# Patient Record
Sex: Female | Born: 1985 | Race: White | Hispanic: No | Marital: Married | Smoking: Never smoker
Health system: Southern US, Community
[De-identification: ages and names within clinical notes are randomized; demographics above are authoritative.]

## PROBLEM LIST (undated history)

## (undated) DIAGNOSIS — E039 Hypothyroidism, unspecified: Secondary | ICD-10-CM

## (undated) HISTORY — DX: Hypothyroidism, unspecified: E03.9

---

## 2018-02-03 DIAGNOSIS — Z01419 Encounter for gynecological examination (general) (routine) without abnormal findings: Secondary | ICD-10-CM | POA: Diagnosis not present

## 2018-06-02 ENCOUNTER — Encounter: Payer: Self-pay | Admitting: Physician Assistant

## 2018-06-02 ENCOUNTER — Other Ambulatory Visit: Payer: Self-pay

## 2018-06-02 ENCOUNTER — Ambulatory Visit (INDEPENDENT_AMBULATORY_CARE_PROVIDER_SITE_OTHER): Payer: 59 | Admitting: Physician Assistant

## 2018-06-02 ENCOUNTER — Ambulatory Visit (INDEPENDENT_AMBULATORY_CARE_PROVIDER_SITE_OTHER): Payer: 59

## 2018-06-02 VITALS — BP 112/64 | HR 62 | Temp 98.9°F | Resp 18 | Ht 63.19 in | Wt 157.0 lb

## 2018-06-02 DIAGNOSIS — Z13228 Encounter for screening for other metabolic disorders: Secondary | ICD-10-CM

## 2018-06-02 DIAGNOSIS — Z23 Encounter for immunization: Secondary | ICD-10-CM | POA: Diagnosis not present

## 2018-06-02 DIAGNOSIS — N63 Unspecified lump in unspecified breast: Secondary | ICD-10-CM

## 2018-06-02 DIAGNOSIS — Z130 Encounter for screening for diseases of the blood and blood-forming organs and certain disorders involving the immune mechanism: Secondary | ICD-10-CM

## 2018-06-02 DIAGNOSIS — Z0001 Encounter for general adult medical examination with abnormal findings: Secondary | ICD-10-CM

## 2018-06-02 DIAGNOSIS — Z1389 Encounter for screening for other disorder: Secondary | ICD-10-CM

## 2018-06-02 DIAGNOSIS — E039 Hypothyroidism, unspecified: Secondary | ICD-10-CM | POA: Diagnosis not present

## 2018-06-02 DIAGNOSIS — Z0000 Encounter for general adult medical examination without abnormal findings: Secondary | ICD-10-CM

## 2018-06-02 DIAGNOSIS — Z7689 Persons encountering health services in other specified circumstances: Secondary | ICD-10-CM

## 2018-06-02 DIAGNOSIS — R918 Other nonspecific abnormal finding of lung field: Secondary | ICD-10-CM | POA: Diagnosis not present

## 2018-06-02 NOTE — Patient Instructions (Addendum)
Health Maintenance, Female Adopting a healthy lifestyle and getting preventive care can go a long way to promote health and wellness. Talk with your health care provider about what schedule of regular examinations is right for you. This is a good chance for you to check in with your provider about disease prevention and staying healthy. In between checkups, there are plenty of things you can do on your own. Experts have done a lot of research about which lifestyle changes and preventive measures are most likely to keep you healthy. Ask your health care provider for more information. Weight and diet Eat a healthy diet  Be sure to include plenty of vegetables, fruits, low-fat dairy products, and lean protein.  Do not eat a lot of foods high in solid fats, added sugars, or salt.  Get regular exercise. This is one of the most important things you can do for your health. ? Most adults should exercise for at least 150 minutes each week. The exercise should increase your heart rate and make you sweat (moderate-intensity exercise). ? Most adults should also do strengthening exercises at least twice a week. This is in addition to the moderate-intensity exercise.  Maintain a healthy weight  Body mass index (BMI) is a measurement that can be used to identify possible weight problems. It estimates body fat based on height and weight. Your health care provider can help determine your BMI and help you achieve or maintain a healthy weight.  For females 20 years of age and older: ? A BMI below 18.5 is considered underweight. ? A BMI of 18.5 to 24.9 is normal. ? A BMI of 25 to 29.9 is considered overweight. ? A BMI of 30 and above is considered obese.  Watch levels of cholesterol and blood lipids  You should start having your blood tested for lipids and cholesterol at 31 years of age, then have this test every 5 years.  You may need to have your cholesterol levels checked more often if: ? Your lipid or  cholesterol levels are high. ? You are older than 31 years of age. ? You are at high risk for heart disease.  Cancer screening Lung Cancer  Lung cancer screening is recommended for adults 55-80 years old who are at high risk for lung cancer because of a history of smoking.  A yearly low-dose CT scan of the lungs is recommended for people who: ? Currently smoke. ? Have quit within the past 15 years. ? Have at least a 30-pack-year history of smoking. A pack year is smoking an average of one pack of cigarettes a day for 1 year.  Yearly screening should continue until it has been 15 years since you quit.  Yearly screening should stop if you develop a health problem that would prevent you from having lung cancer treatment.  Breast Cancer  Practice breast self-awareness. This means understanding how your breasts normally appear and feel.  It also means doing regular breast self-exams. Let your health care provider know about any changes, no matter how small.  If you are in your 20s or 30s, you should have a clinical breast exam (CBE) by a health care provider every 1-3 years as part of a regular health exam.  If you are 40 or older, have a CBE every year. Also consider having a breast X-ray (mammogram) every year.  If you have a family history of breast cancer, talk to your health care provider about genetic screening.  If you are at high risk   for breast cancer, talk to your health care provider about having an MRI and a mammogram every year.  Breast cancer gene (BRCA) assessment is recommended for women who have family members with BRCA-related cancers. BRCA-related cancers include: ? Breast. ? Ovarian. ? Tubal. ? Peritoneal cancers.  Results of the assessment will determine the need for genetic counseling and BRCA1 and BRCA2 testing.  Cervical Cancer Your health care provider may recommend that you be screened regularly for cancer of the pelvic organs (ovaries, uterus, and  vagina). This screening involves a pelvic examination, including checking for microscopic changes to the surface of your cervix (Pap test). You may be encouraged to have this screening done every 3 years, beginning at age 22.  For women ages 56-65, health care providers may recommend pelvic exams and Pap testing every 3 years, or they may recommend the Pap and pelvic exam, combined with testing for human papilloma virus (HPV), every 5 years. Some types of HPV increase your risk of cervical cancer. Testing for HPV may also be done on women of any age with unclear Pap test results.  Other health care providers may not recommend any screening for nonpregnant women who are considered low risk for pelvic cancer and who do not have symptoms. Ask your health care provider if a screening pelvic exam is right for you.  If you have had past treatment for cervical cancer or a condition that could lead to cancer, you need Pap tests and screening for cancer for at least 20 years after your treatment. If Pap tests have been discontinued, your risk factors (such as having a new sexual partner) need to be reassessed to determine if screening should resume. Some women have medical problems that increase the chance of getting cervical cancer. In these cases, your health care provider may recommend more frequent screening and Pap tests.  Colorectal Cancer  This type of cancer can be detected and often prevented.  Routine colorectal cancer screening usually begins at 31 years of age and continues through 31 years of age.  Your health care provider may recommend screening at an earlier age if you have risk factors for colon cancer.  Your health care provider may also recommend using home test kits to check for hidden blood in the stool.  A small camera at the end of a tube can be used to examine your colon directly (sigmoidoscopy or colonoscopy). This is done to check for the earliest forms of colorectal  cancer.  Routine screening usually begins at age 33.  Direct examination of the colon should be repeated every 5-10 years through 31 years of age. However, you may need to be screened more often if early forms of precancerous polyps or small growths are found.  Skin Cancer  Check your skin from head to toe regularly.  Tell your health care provider about any new moles or changes in moles, especially if there is a change in a mole's shape or color.  Also tell your health care provider if you have a mole that is larger than the size of a pencil eraser.  Always use sunscreen. Apply sunscreen liberally and repeatedly throughout the day.  Protect yourself by wearing long sleeves, pants, a wide-brimmed hat, and sunglasses whenever you are outside.  Heart disease, diabetes, and high blood pressure  High blood pressure causes heart disease and increases the risk of stroke. High blood pressure is more likely to develop in: ? People who have blood pressure in the high end of  the normal range (130-139/85-89 mm Hg). ? People who are overweight or obese. ? People who are African American.  If you are 21-29 years of age, have your blood pressure checked every 3-5 years. If you are 3 years of age or older, have your blood pressure checked every year. You should have your blood pressure measured twice-once when you are at a hospital or clinic, and once when you are not at a hospital or clinic. Record the average of the two measurements. To check your blood pressure when you are not at a hospital or clinic, you can use: ? An automated blood pressure machine at a pharmacy. ? A home blood pressure monitor.  If you are between 17 years and 37 years old, ask your health care provider if you should take aspirin to prevent strokes.  Have regular diabetes screenings. This involves taking a blood sample to check your fasting blood sugar level. ? If you are at a normal weight and have a low risk for diabetes,  have this test once every three years after 31 years of age. ? If you are overweight and have a high risk for diabetes, consider being tested at a younger age or more often. Preventing infection Hepatitis B  If you have a higher risk for hepatitis B, you should be screened for this virus. You are considered at high risk for hepatitis B if: ? You were born in a country where hepatitis B is common. Ask your health care provider which countries are considered high risk. ? Your parents were born in a high-risk country, and you have not been immunized against hepatitis B (hepatitis B vaccine). ? You have HIV or AIDS. ? You use needles to inject street drugs. ? You live with someone who has hepatitis B. ? You have had sex with someone who has hepatitis B. ? You get hemodialysis treatment. ? You take certain medicines for conditions, including cancer, organ transplantation, and autoimmune conditions.  Hepatitis C  Blood testing is recommended for: ? Everyone born from [birth year] through 1964. ? Anyone with known risk factors for hepatitis C.  Sexually transmitted infections (STIs)  You should be screened for sexually transmitted infections (STIs) including gonorrhea and chlamydia if: ? You are sexually active and are younger than 31 years of age. ? You are older than 31 years of age and your health care provider tells you that you are at risk for this type of infection. ? Your sexual activity has changed since you were last screened and you are at an increased risk for chlamydia or gonorrhea. Ask your health care provider if you are at risk.  If you do not have HIV, but are at risk, it may be recommended that you take a prescription medicine daily to prevent HIV infection. This is called pre-exposure prophylaxis (PrEP). You are considered at risk if: ? You are sexually active and do not regularly use condoms or know the HIV status of your partner(s). ? You take drugs by injection. ? You are  sexually active with a partner who has HIV.  Talk with your health care provider about whether you are at high risk of being infected with HIV. If you choose to begin PrEP, you should first be tested for HIV. You should then be tested every 3 months for as long as you are taking PrEP. Pregnancy  If you are premenopausal and you may become pregnant, ask your health care provider about preconception counseling.  If you may become  pregnant, take 400 to 800 micrograms (mcg) of folic acid every day.  If you want to prevent pregnancy, talk to your health care provider about birth control (contraception). Osteoporosis and menopause  Osteoporosis is a disease in which the bones lose minerals and strength with aging. This can result in serious bone fractures. Your risk for osteoporosis can be identified using a bone density scan.  If you are 65 years of age or older, or if you are at risk for osteoporosis and fractures, ask your health care provider if you should be screened.  Ask your health care provider whether you should take a calcium or vitamin D supplement to lower your risk for osteoporosis.  Menopause may have certain physical symptoms and risks.  Hormone replacement therapy may reduce some of these symptoms and risks. Talk to your health care provider about whether hormone replacement therapy is right for you. Follow these instructions at home:  Schedule regular health, dental, and eye exams.  Stay current with your immunizations.  Do not use any tobacco products including cigarettes, chewing tobacco, or electronic cigarettes.  If you are pregnant, do not drink alcohol.  If you are breastfeeding, limit how much and how often you drink alcohol.  Limit alcohol intake to no more than 1 drink per day for nonpregnant women. One drink equals 12 ounces of beer, 5 ounces of wine, or 1 ounces of hard liquor.  Do not use street drugs.  Do not share needles.  Ask your health care  provider for help if you need support or information about quitting drugs.  Tell your health care provider if you often feel depressed.  Tell your health care provider if you have ever been abused or do not feel safe at home. This information is not intended to replace advice given to you by your health care provider. Make sure you discuss any questions you have with your health care provider. Document Released: 05/20/2011 Document Revised: 04/11/2016 Document Reviewed: 08/08/2015 Elsevier Interactive Patient Education  2018 Elsevier Inc.     IF you received an x-ray today, you will receive an invoice from [facility]. Please contact [facility] at [phone] with questions or concerns regarding your invoice.   IF you received labwork today, you will receive an invoice from [facility]. Please contact [facility] at [phone] with questions or concerns regarding your invoice.   Our billing staff will not be able to assist you with questions regarding bills from these companies.  You will be contacted with the lab results as soon as they are available. The fastest way to get your results is to activate your My Chart account. Instructions are located on the last page of this paperwork. If you have not heard from us regarding the results in 2 weeks, please contact this office.      

## 2018-06-02 NOTE — Progress Notes (Signed)
April Butler  MRN: [id number] DOB: [birth year]  Subjective:  Pt is a 32 y.o. female who presents for annual physical exam. Pt is not fasting today.  Diet: Counting macros. It is going well. Has lost 5 lbs. Eats lots of cottage cheese, yogurt, fruits, and meat. Low intake of veggies. "not much control over sweets." Drinks mostly water. Takes multivitamin daily.  Exercise: 5 days a week, 60 min a time. Menstrual cycles: Regular, occur monthly. BM: Constipation is baseline, if she takes miralax daily will have normal BM Sleep: 10 hrs a night.   Last dental exam: 05/08/2018 Last vision exam: 04/2018 Last pap smear: 01/2018  Vaccinations      Tetanus: >10 years ago      Chronic issues: 1) Hypothyroidism: Dx since age 61 yo. Managed on synthroid 113mg.   Other issues like to discuss: 1) Bony protrusion of right breast x years.  It is not painful.  No overlying redness, warmth, or skin changes.  Has not changed in size.  Denies nipple discharge.  No acute trauma to the breast.  No personal hx of breast cancer. No family history of breast cancer.   There are no active problems to display for this patient.   Current Outpatient Medications on File Prior to Visit  Medication Sig Dispense Refill  . levothyroxine (SYNTHROID, LEVOTHROID) 125 MCG tablet Take 125 mcg by mouth daily before breakfast.     No current facility-administered medications on file prior to visit.     No Known Allergies  Social History   Socioeconomic History  . Marital status: Single    Spouse name: Not on file  . Number of children: 0  . Years of education: Not on file  . Highest education level: Not on file  Occupational History  . Not on file  Social Needs  . Financial resource strain: Not hard at all  . Food insecurity:    Worry: Never true    Inability: Never true  . Transportation needs:    Medical: No    Non-medical: No  Tobacco Use  . Smoking status: Never Smoker  . Smokeless tobacco:  Never Used  Substance and Sexual Activity  . Alcohol use: Yes    Comment: socially  . Drug use: Never  . Sexual activity: Yes    Partners: Female    Comment: with monogamous partner  Lifestyle  . Physical activity:    Days per week: 5 days    Minutes per session: 60 min  . Stress: Not at all  Relationships  . Social connections:    Talks on phone: More than three times a week    Gets together: More than three times a week    Attends religious service: More than 4 times per year    Active member of club or organization: Yes    Attends meetings of clubs or organizations: More than 4 times per year    Relationship status: Living with partner  Other Topics Concern  . Not on file  Social History Narrative   [place] Works in sInsurance claims handlerat [employer]     No family history on file.  Review of Systems  Constitutional: Negative for activity change, appetite change, chills, diaphoresis, fatigue, fever and unexpected weight change.  HENT: Negative for congestion, dental problem, drooling, ear discharge, ear pain, facial swelling, hearing loss, mouth sores, nosebleeds, postnasal drip, rhinorrhea, sinus pressure, sinus pain, sneezing, sore throat, tinnitus, trouble swallowing and voice  change.   Eyes: Negative for photophobia, pain, discharge, redness, itching and visual disturbance.  Respiratory: Negative for apnea, cough, choking, chest tightness, shortness of breath, wheezing and stridor.   Cardiovascular: Negative for chest pain, palpitations and leg swelling.  Gastrointestinal: Negative for abdominal distention, abdominal pain, anal bleeding, blood in stool, constipation, diarrhea, nausea, rectal pain and vomiting.  Endocrine: Negative for cold intolerance, heat intolerance, polydipsia, polyphagia and polyuria.  Genitourinary: Negative for decreased urine volume, difficulty urinating, dyspareunia, dysuria, enuresis, flank pain, frequency,  genital sores, hematuria, menstrual problem, pelvic pain, urgency, vaginal bleeding, vaginal discharge and vaginal pain.  Musculoskeletal: Negative for arthralgias, back pain, gait problem, joint swelling, myalgias, neck pain and neck stiffness.  Skin: Negative for color change, pallor, rash and wound.  Allergic/Immunologic: Negative for environmental allergies, food allergies and immunocompromised state.  Neurological: Negative for dizziness, tremors, seizures, syncope, facial asymmetry, speech difficulty, weakness, light-headedness, numbness and headaches.  Hematological: Negative for adenopathy. Does not bruise/bleed easily.  Psychiatric/Behavioral: Negative for agitation, behavioral problems, confusion, decreased concentration, dysphoric mood, hallucinations, self-injury, sleep disturbance and suicidal ideas. The patient is not nervous/anxious and is not hyperactive.     Objective:  BP 112/64   Pulse 62   Temp 98.9 F (37.2 C) (Oral)   Resp 18   Ht 5' 3.19" (1.605 m)   Wt 157 lb (71.2 kg)   LMP 05/17/2018   SpO2 98%   BMI 27.65 kg/m   Physical Exam  Constitutional: She is oriented to person, place, and time. She appears well-developed and well-nourished. No distress.  HENT:  Head: Normocephalic and atraumatic.  Right Ear: Hearing, tympanic membrane, external ear and ear canal normal.  Left Ear: Hearing, tympanic membrane, external ear and ear canal normal.  Nose: Nose normal.  Mouth/Throat: Uvula is midline, oropharynx is clear and moist and mucous membranes are normal. No oropharyngeal exudate.  Eyes: Pupils are equal, round, and reactive to light. Conjunctivae, EOM and lids are normal. No scleral icterus.  Neck: Trachea normal and normal range of motion. No thyroid mass and no thyromegaly present.  Cardiovascular: Normal rate, regular rhythm, normal heart sounds and intact distal pulses.  Pulmonary/Chest: Effort normal and breath sounds normal. Right breast exhibits mass ( 2  palpable masses, please see graphic documenation for details). Right breast exhibits no inverted nipple, no nipple discharge, no skin change and no tenderness. Left breast exhibits no inverted nipple, no mass, no nipple discharge, no skin change and no tenderness.    Abdominal: Soft. Normal appearance and bowel sounds are normal. There is no tenderness.  Lymphadenopathy:       Head (right side): No tonsillar, no preauricular, no posterior auricular and no occipital adenopathy present.       Head (left side): No tonsillar, no preauricular, no posterior auricular and no occipital adenopathy present.    She has no cervical adenopathy.       Right: No supraclavicular adenopathy present.       Left: No supraclavicular adenopathy present.  Neurological: She is alert and oriented to person, place, and time. She has normal strength and normal reflexes.  Skin: Skin is warm and dry.      Visual Acuity Screening   Right eye Left eye Both eyes  Without correction:     With correction: _0   Dg Sternum  Result Date: 06/02/2018 CLINICAL DATA:  Heart fixed mass just lateral to the sternum EXAM: STERNUM - 2+ VIEW COMPARISON:  None. FINDINGS: The area in question was  not marked with a small marker, but on the images obtained no sternal abnormality is seen. The sternum does turn slightly inward near the xiphoid process which is a normal variation. No retrosternal abnormality is seen. IMPRESSION: Negative views of the sternum. If necessary to assess further consider CT with marker over the area of concern. Electronically Signed   By: Ivar Drape M.D.   On: 06/02/2018 17:27    Assessment and Plan :  Discussed healthy lifestyle, diet, exercise, preventative care, vaccinations, and addressed patient's concerns. Plan for follow up in 6 months. Otherwise, plan for specific conditions below.  1. Annual physical exam Pt will return for fasting lab work.   2. Encounter to establish care I am happy  to take over pt's care.  3. Breast mass Unclear etiology at this time. No bony abnormality noted on plain films today. Proceed with [place].  - US BREAST LTD UNI RIGHT INC AXILLA; Future - DG Sternum; Future  4. Hypothyroidism, unspecified type - Lipid panel; Future - TSH; Future  5. Screening for hematuria or proteinuria - Urinalysis, dipstick only  6. Screening for deficiency anemia - CBC with Differential/Platelet; Future  7. Screening for metabolic disorder - NGI71+LLVD; Future  Tenna Delaine, PA-C  [facility] 06/03/2018 7:31 AM

## 2018-06-03 ENCOUNTER — Encounter: Payer: Self-pay | Admitting: Physician Assistant

## 2018-06-03 LAB — URINALYSIS, DIPSTICK ONLY
Bilirubin, UA: NEGATIVE
Glucose, UA: NEGATIVE
KETONES UA: NEGATIVE
LEUKOCYTES UA: NEGATIVE
NITRITE UA: NEGATIVE
Protein, UA: NEGATIVE
RBC, UA: NEGATIVE
Specific Gravity, UA: >=1.03 — AB (ref 1.005–1.030)
Urobilinogen, Ur: 0.2 mg/dL (ref 0.2–1.0)
pH, UA: 5 (ref 5.0–7.5)

## 2018-06-04 ENCOUNTER — Ambulatory Visit: Payer: 59

## 2018-06-04 DIAGNOSIS — Z13228 Encounter for screening for other metabolic disorders: Secondary | ICD-10-CM

## 2018-06-04 DIAGNOSIS — E039 Hypothyroidism, unspecified: Secondary | ICD-10-CM | POA: Diagnosis not present

## 2018-06-04 DIAGNOSIS — Z130 Encounter for screening for diseases of the blood and blood-forming organs and certain disorders involving the immune mechanism: Secondary | ICD-10-CM | POA: Diagnosis not present

## 2018-06-05 LAB — CBC WITH DIFFERENTIAL/PLATELET
BASOS: 0 %
Basophils Absolute: 0 10*3/uL (ref 0.0–0.2)
EOS (ABSOLUTE): 0.3 10*3/uL (ref 0.0–0.4)
Eos: 5 %
Hematocrit: 43.1 % (ref 34.0–46.6)
Hemoglobin: 14.3 g/dL (ref 11.1–15.9)
Immature Grans (Abs): 0 10*3/uL (ref 0.0–0.1)
Immature Granulocytes: 0 %
Lymphocytes Absolute: 2 10*3/uL (ref 0.7–3.1)
Lymphs: 31 %
MCH: 29.7 pg (ref 26.6–33.0)
MCHC: 33.2 g/dL (ref 31.5–35.7)
MCV: 89 fL (ref 79–97)
MONOS ABS: 0.5 10*3/uL (ref 0.1–0.9)
Monocytes: 7 %
NEUTROS ABS: 3.7 10*3/uL (ref 1.4–7.0)
NEUTROS PCT: 57 %
PLATELETS: 192 10*3/uL (ref 150–450)
RBC: 4.82 x10E6/uL (ref 3.77–5.28)
RDW: 13.6 % (ref 12.3–15.4)
WBC: 6.5 10*3/uL (ref 3.4–10.8)

## 2018-06-05 LAB — CMP14+EGFR
ALT: 15 IU/L (ref 0–32)
AST: 15 IU/L (ref 0–40)
Albumin/Globulin Ratio: 1.9 (ref 1.2–2.2)
Albumin: 4.6 g/dL (ref 3.5–5.5)
Alkaline Phosphatase: 86 IU/L (ref 39–117)
BUN/Creatinine Ratio: 15 (ref 9–23)
BUN: 15 mg/dL (ref 6–20)
Bilirubin Total: 0.6 mg/dL (ref 0.0–1.2)
CHLORIDE: 104 mmol/L (ref 96–106)
CO2: 21 mmol/L (ref 20–29)
Calcium: 10 mg/dL (ref 8.7–10.2)
Creatinine, Ser: 0.97 mg/dL (ref 0.57–1.00)
GFR calc Af Amer: 91 mL/min/{1.73_m2} (ref >59)
GFR, EST NON AFRICAN AMERICAN: 79 mL/min/{1.73_m2} (ref >59)
GLOBULIN, TOTAL: 2.4 g/dL (ref 1.5–4.5)
GLUCOSE: 76 mg/dL (ref 65–99)
POTASSIUM: 5.4 mmol/L — AB (ref 3.5–5.2)
SODIUM: 143 mmol/L (ref 134–144)
TOTAL PROTEIN: 7 g/dL (ref 6.0–8.5)

## 2018-06-05 LAB — LIPID PANEL
CHOLESTEROL TOTAL: 153 mg/dL (ref 100–199)
Chol/HDL Ratio: 3 ratio (ref 0.0–4.4)
HDL: 51 mg/dL (ref >39)
LDL Calculated: 93 mg/dL (ref 0–99)
Triglycerides: 46 mg/dL (ref 0–149)
VLDL CHOLESTEROL CAL: 9 mg/dL (ref 5–40)

## 2018-06-05 LAB — TSH: TSH: 0.173 u[IU]/mL — AB (ref 0.450–4.500)

## 2018-06-09 ENCOUNTER — Telehealth: Payer: Self-pay | Admitting: Physician Assistant

## 2018-06-09 ENCOUNTER — Encounter: Payer: Self-pay | Admitting: Radiology

## 2018-06-09 MED ORDER — LEVOTHYROXINE SODIUM 112 MCG PO TABS: 112.0000 ug | ORAL_TABLET | Freq: Every day | ORAL | 3 refills | Status: DC

## 2018-06-09 NOTE — Telephone Encounter (Signed)
Meds ordered this encounter  Medications  . levothyroxine (SYNTHROID, LEVOTHROID) 112 MCG tablet    Sig: Take 1 tablet (112 mcg total) by mouth daily.    Dispense:  90 tablet    Refill:  3    Order Specific Question:   Supervising Provider    Answer:   Ethelda ChickSMITH, KRISTI M [[id number]]

## 2018-06-12 ENCOUNTER — Encounter: Payer: Self-pay | Admitting: Physician Assistant

## 2018-06-12 ENCOUNTER — Other Ambulatory Visit: Payer: Self-pay | Admitting: Physician Assistant

## 2018-06-12 DIAGNOSIS — N63 Unspecified lump in unspecified breast: Secondary | ICD-10-CM

## 2018-06-15 ENCOUNTER — Ambulatory Visit
Admission: RE | Admit: 2018-06-15 | Discharge: 2018-06-15 | Disposition: A | Discharge status: Home / Self Care | Payer: 59 | Source: Ambulatory Visit | Attending: Physician Assistant | Admitting: Physician Assistant

## 2018-06-15 DIAGNOSIS — N6310 Unspecified lump in the right breast, unspecified quadrant: Secondary | ICD-10-CM | POA: Diagnosis not present

## 2018-06-15 DIAGNOSIS — R922 Inconclusive mammogram: Secondary | ICD-10-CM | POA: Diagnosis not present

## 2018-06-15 DIAGNOSIS — N63 Unspecified lump in unspecified breast: Secondary | ICD-10-CM

## 2018-06-22 ENCOUNTER — Telehealth: Payer: Self-pay | Admitting: Physician Assistant

## 2018-06-22 NOTE — Telephone Encounter (Signed)
Called pt. No answer. Will send mychart letter.

## 2018-06-23 NOTE — Telephone Encounter (Signed)
Letter faxed today, confirmation received.

## 2018-06-23 NOTE — Telephone Encounter (Signed)
Pt calling back with a fax number for the letter to be faxed to. Please fax to [phone]. Please let pt know once faxed.

## 2018-06-23 NOTE — Telephone Encounter (Signed)
Pt calling and states the work note is not showing up in her Mychart under letters. Can this be resent to mychart? Please advise pt when letter has been re-submitted to mychart.

## 2018-06-23 NOTE — Telephone Encounter (Signed)
Letter has been printed and ready for pick-up at the [address].

## 2018-08-07 ENCOUNTER — Encounter: Payer: Self-pay | Admitting: Physician Assistant

## 2018-08-12 ENCOUNTER — Other Ambulatory Visit: Payer: Self-pay | Admitting: Physician Assistant

## 2018-08-12 DIAGNOSIS — E559 Vitamin D deficiency, unspecified: Secondary | ICD-10-CM

## 2018-08-12 DIAGNOSIS — E039 Hypothyroidism, unspecified: Secondary | ICD-10-CM

## 2018-09-02 ENCOUNTER — Encounter: Payer: Self-pay | Admitting: Physician Assistant

## 2018-09-02 ENCOUNTER — Other Ambulatory Visit: Payer: Self-pay

## 2018-09-02 ENCOUNTER — Ambulatory Visit: Payer: BLUE CROSS/BLUE SHIELD | Admitting: Physician Assistant

## 2018-09-02 VITALS — BP 106/70 | HR 60 | Temp 98.0°F | Resp 18 | Ht 63.78 in | Wt 163.0 lb

## 2018-09-02 DIAGNOSIS — M62838 Other muscle spasm: Secondary | ICD-10-CM

## 2018-09-02 DIAGNOSIS — E875 Hyperkalemia: Secondary | ICD-10-CM

## 2018-09-02 DIAGNOSIS — Z309 Encounter for contraceptive management, unspecified: Secondary | ICD-10-CM

## 2018-09-02 DIAGNOSIS — S161XXA Strain of muscle, fascia and tendon at neck level, initial encounter: Secondary | ICD-10-CM

## 2018-09-02 DIAGNOSIS — Z23 Encounter for immunization: Secondary | ICD-10-CM | POA: Diagnosis not present

## 2018-09-02 DIAGNOSIS — E039 Hypothyroidism, unspecified: Secondary | ICD-10-CM

## 2018-09-02 MED ORDER — NORGESTIMATE-ETH ESTRADIOL 0.25-35 MG-MCG PO TABS: 1.0000 | ORAL_TABLET | Freq: Every day | ORAL | 4 refills | Status: DC

## 2018-09-02 MED ORDER — CYCLOBENZAPRINE HCL 5 MG PO TABS: 5.0000 mg | ORAL_TABLET | Freq: Three times a day (TID) | ORAL | 0 refills | Status: DC | PRN

## 2018-09-02 NOTE — Progress Notes (Signed)
April Butler  MRN: [id number] DOB: 02/22/1986  Subjective:  April Butler is a 32 y.o. female seen in office today for a chief complaint of neck pain x 3 months, medication refill for birth control,  and follow up on labs.   Neck pain: Has noticed right-sided neck musculature pain since starting to lift weights more 3 months ago.  Denies midline tenderness, decreased range of motion, numbness, tingling, weakness of bilateral upper extremities, and radiating symptoms.  Stretches occasionally but not every time she works out.  Has not tried anything for pain.  She recently started back on OCP but is almost out.  Takes for cramping and to regulate cycles. LMP 08/27/18. Sexually active with female monogamous partner. No past medical history of HTN, heart disease, breast cancer, endometrial cancer, thromboembolism, migraine with aura, or diabetes.  No FH of stroke or thromboembolism.   Synthroid dose adjusted in 05/2018 due to low TSH.  Currently managed with synthroid daily.  Has not noticed any difference. No symptoms. Also at this time potassium was slightly elevated at 5.4.   Review of Systems  Constitutional: Negative for unexpected weight change.  Respiratory: Negative for cough and shortness of breath.   Cardiovascular: Negative for chest pain, palpitations and leg swelling.  Endocrine: Negative for cold intolerance and heat intolerance.  Neurological: Negative for dizziness and light-headedness.      There are no active problems to display for this patient.   Current Outpatient Medications on File Prior to Visit  Medication Sig Dispense Refill  . levothyroxine (SYNTHROID, LEVOTHROID) 112 MCG tablet Take 1 tablet (112 mcg total) by mouth daily. 90 tablet 3  . norgestimate-ethinyl estradiol (ORTHO-CYCLEN,SPRINTEC,PREVIFEM) 0.25-35 MG-MCG tablet Take 1 tablet by mouth daily.     No current facility-administered medications on file prior to visit.     No Known  Allergies    Social History   Socioeconomic History  . Marital status: Married    Spouse name: Not on file  . Number of children: 0  . Years of education: Not on file  . Highest education level: Not on file  Occupational History  . Not on file  Social Needs  . Financial resource strain: Not hard at all  . Food insecurity:    Worry: Never true    Inability: Never true  . Transportation needs:    Medical: No    Non-medical: No  Tobacco Use  . Smoking status: Never Smoker  . Smokeless tobacco: Never Used  Substance and Sexual Activity  . Alcohol use: Yes    Comment: socially  . Drug use: Never  . Sexual activity: Yes    Partners: Female    Birth control/protection: Pill    Comment: with monogamous partner  Lifestyle  . Physical activity:    Days per week: 5 days    Minutes per session: 60 min  . Stress: Not at all  Relationships  . Social connections:    Talks on phone: More than three times a week    Gets together: More than three times a week    Attends religious service: More than 4 times per year    Active member of club or organization: Yes    Attends meetings of clubs or organizations: More than 4 times per year    Relationship status: Living with partner  . Intimate partner violence:    Fear of current or ex partner: No    Emotionally abused: No    Physically abused: No  Forced sexual activity: No  Other Topics Concern  . Not on file  Social History Narrative   Just moved from [place] with her fiance. Works in Data processing manager at [employer].    Objective:  BP 106/70   Pulse 60   Temp 98 F (36.7 C) (Oral)   Resp 18   Ht 5' 3.78" (1.62 m)   Wt 163 lb (73.9 kg)   LMP 08/27/2018 (Exact Date)   SpO2 97%   BMI 28.17 kg/m   Physical Exam  Constitutional: She is oriented to person, place, and time. She appears well-developed and well-nourished.  HENT:  Head: Normocephalic and atraumatic.  Eyes: Conjunctivae are normal.  Neck: Normal range of  motion and full passive range of motion without pain. Muscular tenderness (tight musculature noted in right sided trapezius muscle, small spasm palpated ) present. No spinous process tenderness present. No neck rigidity. No edema, no erythema and normal range of motion present.  Pulmonary/Chest: Effort normal.  Neurological: She is alert and oriented to person, place, and time. She has normal strength. No sensory deficit.  Reflex Scores:      Tricep reflexes are 2+ on the right side and 2+ on the left side.      Bicep reflexes are 2+ on the right side and 2+ on the left side.      Brachioradialis reflexes are 2+ on the right side and 2+ on the left side. Negative Spurling's maneuver bilaterally.  Skin: Skin is warm and dry.  Psychiatric: She has a normal mood and affect.  Vitals reviewed.   Assessment and Plan :  1. Strain of neck muscle, initial encounter 2. Neck muscle spasm History and physical exam findings consistent with strain and spasm.  Recommended rest, heating pad, and muscle relaxants and over-the-counter NSAIDs as needed.  Every time before and after lifting weights.  May benefit from massage therapy.  If tightness persist, can consider cupping.  Neuro exam normal.  Follow-up as needed. - cyclobenzaprine (FLEXERIL) 5 MG tablet; Take 1 tablet (5 mg total) by mouth 3 (three) times daily as needed for muscle spasms.  Dispense: 60 tablet; Refill: 0  3. Encounter for contraceptive management, unspecified type - norgestimate-ethinyl estradiol (ORTHO-CYCLEN,SPRINTEC,PREVIFEM) 0.25-35 MG-MCG tablet; Take 1 tablet by mouth daily.  Dispense: 3 Package; Refill: 4  4. Hypothyroidism, unspecified type Labs pending.  Continue with current Synthroid dose at this time.  May adjust dose depending on labs. - TSH  5. Serum potassium elevated - Basic metabolic panel  6. Needs flu shot - Flu Vaccine QUAD 36+ mos IM  Side effects, risks, benefits, and alternatives of the medications and  treatment plan prescribed today were discussed, and patient expressed understanding of the instructions given. No barriers to understanding were identified. Red flags discussed in detail. Pt expressed understanding regarding what to do in case of emergency/urgent symptoms.  Benjiman Core PA-C  Primary [facility] at [facility] 09/02/2018 9:50 AM

## 2018-09-02 NOTE — Patient Instructions (Addendum)
I rec taking one week off of lifting. Use heating pad to area. Muscle relaxant as needed (this can make you drowsy). I suggest stretching before and after lifting.   You can consider massage therapy and cupping.   If you have lab work done today you will be contacted with your lab results within the next 2 weeks.  If you have not heard from [place] then please contact us. The fastest way to get your results is to register for My Chart.  Neck Exercises Neck exercises can be important for many reasons:  They can help you to improve and maintain flexibility in your neck. This can be especially important as you age.  They can help to make your neck stronger. This can make movement easier.  They can reduce or prevent neck pain.  They may help your upper back.  Ask your health care provider which neck exercises would be best for you. Exercises Neck Press Repeat this exercise 10 times. Do it first thing in the morning and right before bed or as told by your health care provider. 1. Lie on your back on a firm bed or on the floor with a pillow under your head. 2. Use your neck muscles to push your head down on the pillow and straighten your spine. 3. Hold the position as well as you can. Keep your head facing up and your chin tucked. 4. Slowly count to 5 while holding this position. 5. Relax for a few seconds. Then repeat.  Isometric Strengthening Do a full set of these exercises 2 times a day or as told by your health care provider. 1. Sit in a supportive chair and place your hand on your forehead. 2. Push forward with your head and neck while pushing back with your hand. Hold for 10 seconds. 3. Relax. Then repeat the exercise 3 times. 4. Next, do thesequence again, this time putting your hand against the back of your head. Use your head and neck to push backward against the hand pressure. 5. Finally, do the same exercise on either side of your head, pushing sideways against the pressure of your  hand.  Prone Head Lifts Repeat this exercise 5 times. Do this 2 times a day or as told by your health care provider. 1. Lie face-down, resting on your elbows so that your chest and upper back are raised. 2. Start with your head facing downward, near your chest. Position your chin either on or near your chest. 3. Slowly lift your head upward. Lift until you are looking straight ahead. Then continue lifting your head as far back as you can stretch. 4. Hold your head up for 5 seconds. Then slowly lower it to your starting position.  Supine Head Lifts Repeat this exercise 8-10 times. Do this 2 times a day or as told by your health care provider. 1. Lie on your back, bending your knees to point to the ceiling and keeping your feet flat on the floor. 2. Lift your head slowly off the floor, raising your chin toward your chest. 3. Hold for 5 seconds. 4. Relax and repeat.  Scapular Retraction Repeat this exercise 5 times. Do this 2 times a day or as told by your health care provider. 1. Stand with your arms at your sides. Look straight ahead. 2. Slowly pull both shoulders backward and downward until you feel a stretch between your shoulder blades in your upper back. 3. Hold for 10-30 seconds. 4. Relax and repeat.  Contact a  health care provider if:  Your neck pain or discomfort gets much worse when you do an exercise.  Your neck pain or discomfort does not improve within 2 hours after you exercise. If you have any of these problems, stop exercising right away. Do not do the exercises again unless your health care provider says that you can. Get help right away if:  You develop sudden, severe neck pain. If this happens, stop exercising right away. Do not do the exercises again unless your health care provider says that you can. Exercises Neck Stretch  Repeat this exercise 3-5 times. 1. Do this exercise while standing or while sitting in a chair. 2. Place your feet flat on the floor,  shoulder-width apart. 3. Slowly turn your head to the right. Turn it all the way to the right so you can look over your right shoulder. Do not tilt or tip your head. 4. Hold this position for 10-30 seconds. 5. Slowly turn your head to the left, to look over your left shoulder. 6. Hold this position for 10-30 seconds.  Neck Retraction Repeat this exercise 8-10 times. Do this 3-4 times a day or as told by your health care provider. 1. Do this exercise while standing or while sitting in a sturdy chair. 2. Look straight ahead. Do not bend your neck. 3. Use your fingers to push your chin backward. Do not bend your neck for this movement. Continue to face straight ahead. If you are doing the exercise properly, you will feel a slight sensation in your throat and a stretch at the back of your neck. 4. Hold the stretch for 1-2 seconds. Relax and repeat.  This information is not intended to replace advice given to you by your health care provider. Make sure you discuss any questions you have with your health care provider. Document Released: 10/16/2015 Document Revised: 04/11/2016 Document Reviewed: 05/15/2015 Elsevier Interactive Patient Education  2018 [employer].   IF you received an x-ray today, you will receive an invoice from [facility]. Please contact [facility] at [phone] with questions or concerns regarding your invoice.   IF you received labwork today, you will receive an invoice from [facility]. Please contact [facility] at [phone] with questions or concerns regarding your invoice.   Our billing staff will not be able to assist you with questions regarding bills from these companies.  You will be contacted with the lab results as soon as they are available. The fastest way to get your results is to activate your My Chart account. Instructions are located on the last page of this paperwork. If you have not heard from [place] regarding the results in 2 weeks, please  contact this office.

## 2018-09-03 ENCOUNTER — Other Ambulatory Visit: Payer: Self-pay | Admitting: Physician Assistant

## 2018-09-03 LAB — BASIC METABOLIC PANEL
BUN / CREAT RATIO: 21 (ref 9–23)
BUN: 21 mg/dL — ABNORMAL HIGH (ref 6–20)
CHLORIDE: 105 mmol/L (ref 96–106)
CO2: 23 mmol/L (ref 20–29)
Calcium: 10.1 mg/dL (ref 8.7–10.2)
Creatinine, Ser: 0.98 mg/dL (ref 0.57–1.00)
GFR calc non Af Amer: 77 mL/min/{1.73_m2} (ref >59)
GFR, EST AFRICAN AMERICAN: 89 mL/min/{1.73_m2} (ref >59)
Glucose: 81 mg/dL (ref 65–99)
POTASSIUM: 5.1 mmol/L (ref 3.5–5.2)
SODIUM: 143 mmol/L (ref 134–144)

## 2018-09-03 LAB — TSH: TSH: 3.71 u[IU]/mL (ref 0.450–4.500)

## 2018-09-03 NOTE — Progress Notes (Signed)
Opened in error

## 2018-09-04 ENCOUNTER — Encounter: Payer: Self-pay | Admitting: Physician Assistant

## 2018-10-07 ENCOUNTER — Encounter: Payer: Self-pay | Admitting: Physician Assistant

## 2019-03-27 IMAGING — DX DG STERNUM 2+V
2 series · 2 of 2 positions shown · non-contrast
Comparison: None.

CLINICAL DATA: Heart fixed mass just lateral to the sternum

EXAM:
STERNUM - 2+ VIEW

[sternum obl]
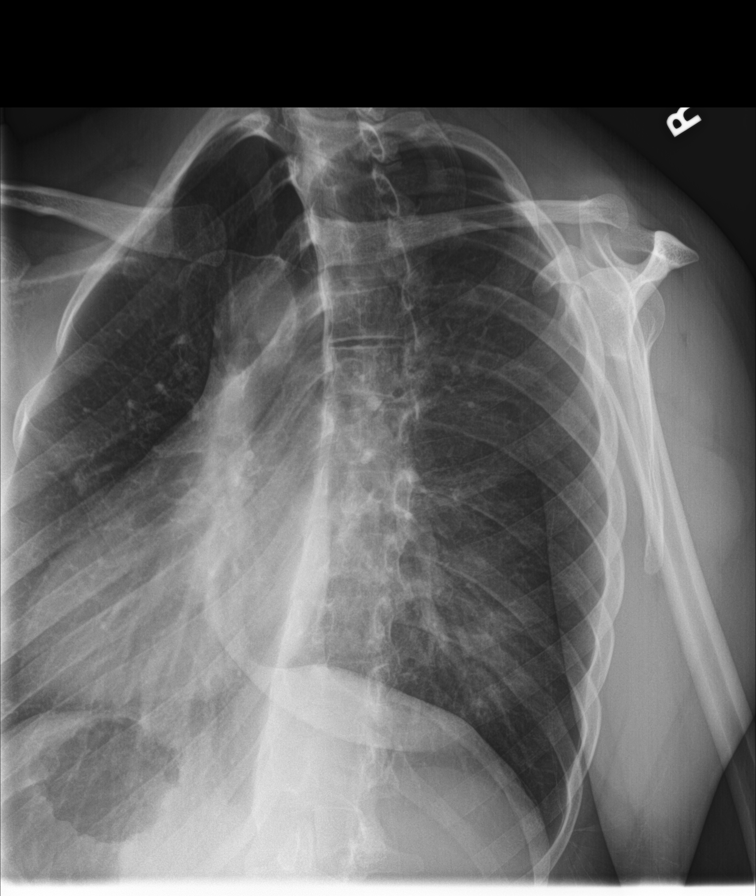

[chest lat]
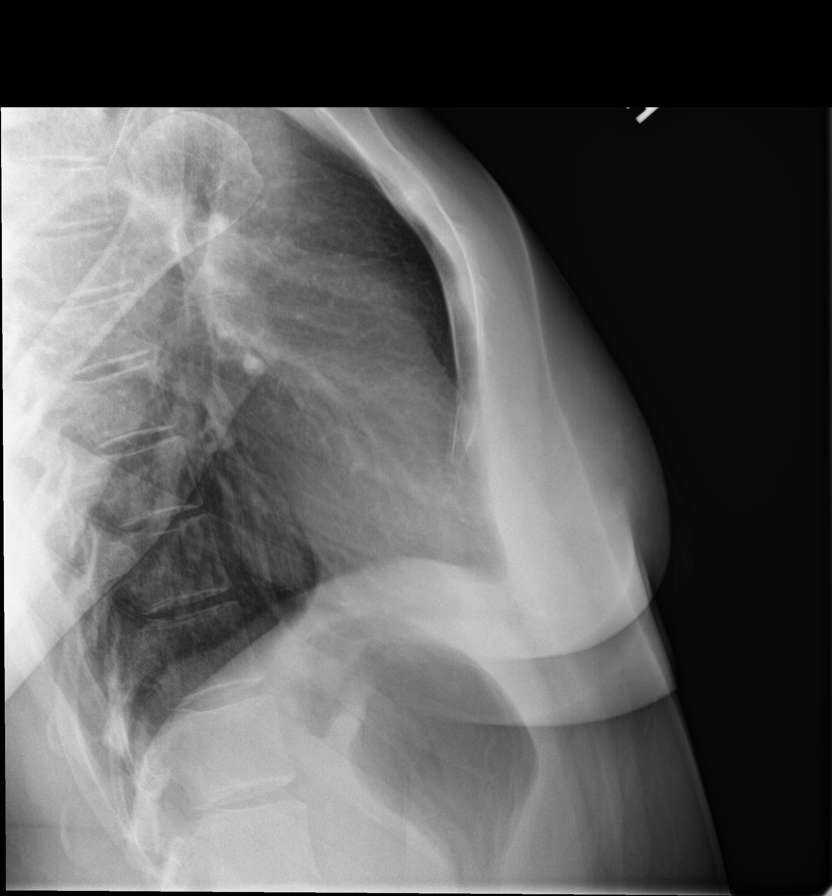

[2 of 2 positions shown; findings below may reference images not displayed]

FINDINGS: The area in question was not marked with a small marker, but on the
images obtained no sternal abnormality is seen. The sternum does
turn slightly inward near the xiphoid process which is a normal
variation. No retrosternal abnormality is seen.
IMPRESSION: Negative views of the sternum. If necessary to assess further
consider CT with marker over the area of concern.

## 2019-04-27 ENCOUNTER — Encounter: Payer: Self-pay | Admitting: Registered Nurse

## 2019-04-27 ENCOUNTER — Other Ambulatory Visit: Payer: Self-pay

## 2019-04-27 ENCOUNTER — Ambulatory Visit (INDEPENDENT_AMBULATORY_CARE_PROVIDER_SITE_OTHER): Payer: BC Managed Care – PPO | Admitting: Registered Nurse

## 2019-04-27 VITALS — BP 108/68 | HR 62 | Temp 98.7°F | Resp 16 | Ht 62.0 in | Wt 168.0 lb

## 2019-04-27 DIAGNOSIS — Z309 Encounter for contraceptive management, unspecified: Secondary | ICD-10-CM | POA: Diagnosis not present

## 2019-04-27 DIAGNOSIS — Z1329 Encounter for screening for other suspected endocrine disorder: Secondary | ICD-10-CM | POA: Diagnosis not present

## 2019-04-27 DIAGNOSIS — Z7689 Persons encountering health services in other specified circumstances: Secondary | ICD-10-CM

## 2019-04-27 DIAGNOSIS — Z130 Encounter for screening for diseases of the blood and blood-forming organs and certain disorders involving the immune mechanism: Secondary | ICD-10-CM

## 2019-04-27 DIAGNOSIS — Z1159 Encounter for screening for other viral diseases: Secondary | ICD-10-CM | POA: Diagnosis not present

## 2019-04-27 DIAGNOSIS — Z13228 Encounter for screening for other metabolic disorders: Secondary | ICD-10-CM | POA: Diagnosis not present

## 2019-04-27 DIAGNOSIS — E039 Hypothyroidism, unspecified: Secondary | ICD-10-CM | POA: Diagnosis not present

## 2019-04-27 MED ORDER — NORGESTIMATE-ETH ESTRADIOL 0.25-35 MG-MCG PO TABS: 1.0000 | ORAL_TABLET | Freq: Every day | ORAL | 4 refills | Status: AC

## 2019-04-27 NOTE — Progress Notes (Signed)
Established Patient Office Visit  Subjective:  Patient ID: April AdjutantCharlotte Torry, female    DOB: [birth year]  Age: 33 y.o. MRN: [id number]  CC:  Chief Complaint  Patient presents with  . Transitions Of Care    pt needs new PCP to manage medication     HPI April Butler is a very pleasant 33 y.o. female presenting for transfer of care visit, labs  Pt has history significant for hypothyroidism. Has been taking Synthroid 112mcg daily with good effect - has recently gained 5 lbs and is having trouble losing the weight - is curious if it is a thyroid issue. She is due for follow up labs, we will check TSH today.  She also reports a history of IDA - will check CBC today.   Pt will fill Care Gap of HIV test today as well. No other health maintenance needed at this time.  Past Medical History:  Diagnosis Date  . Hypothyroidism     History reviewed. No pertinent surgical history.  Family History  Problem Relation Age of Onset  . Hypertension Mother   . Hyperlipidemia Father   . Cancer Maternal Grandmother   . Depression Paternal Grandfather   . Diabetes Mellitus II Paternal Grandfather     Social History   Socioeconomic History  . Marital status: Married    Spouse name: Not on file  . Number of children: 0  . Years of education: Not on file  . Highest education level: Not on file  Occupational History  . Not on file  Social Needs  . Financial resource strain: Not hard at all  . Food insecurity:    Worry: Never true    Inability: Never true  . Transportation needs:    Medical: No    Non-medical: No  Tobacco Use  . Smoking status: Never Smoker  . Smokeless tobacco: Never Used  Substance and Sexual Activity  . Alcohol use: Yes    Comment: socially  . Drug use: Never  . Sexual activity: Yes    Partners: Female    Birth control/protection: Pill    Comment: with monogamous partner  Lifestyle  . Physical activity:    Days per week: 5 days    Minutes per session: 60  min  . Stress: Not at all  Relationships  . Social connections:    Talks on phone: More than three times a week    Gets together: More than three times a week    Attends religious service: More than 4 times per year    Active member of club or organization: Yes    Attends meetings of clubs or organizations: More than 4 times per year    Relationship status: Living with partner  . Intimate partner violence:    Fear of current or ex partner: No    Emotionally abused: No    Physically abused: No    Forced sexual activity: No  Other Topics Concern  . Not on file  Social History Narrative   [place]. 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    Outpatient Medications Prior to Visit  Medication Sig Dispense Refill  . levothyroxine (SYNTHROID, LEVOTHROID) 112 MCG tablet Take 1 tablet (112 mcg total) by mouth daily. 90 tablet 3  . cyclobenzaprine (FLEXERIL) 5 MG tablet Take 1 tablet (5 mg total) by mouth 3 (three) times daily as needed for muscle spasms. 60 tablet 0  . norgestimate-ethinyl estradiol (ORTHO-CYCLEN,SPRINTEC,PREVIFEM) 0.25-35 MG-MCG tablet Take 1  tablet by mouth daily. 3 Package 4   No facility-administered medications prior to visit.     No Known Allergies  ROS Review of Systems  Constitutional: Positive for activity change and unexpected weight change. Negative for appetite change, chills and fatigue.       Per HPI  HENT: Negative.   Eyes: Negative.   Respiratory: Negative.   Cardiovascular: Negative.   Gastrointestinal: Negative.   Endocrine: Negative.  Negative for cold intolerance and heat intolerance.  Genitourinary: Negative.   Musculoskeletal: Negative.   Skin: Negative.   Allergic/Immunologic: Negative.   Neurological: Negative.   Hematological: Negative.   Psychiatric/Behavioral: Negative.   All other systems reviewed and are negative.     Objective:    Physical Exam  Constitutional: She is oriented to person,  place, and time. She appears well-developed and well-nourished.  HENT:  Head: Normocephalic and atraumatic.  Cardiovascular: Normal rate, regular rhythm and normal heart sounds. Exam reveals no gallop and no friction rub.  No murmur heard. Pulmonary/Chest: Effort normal and breath sounds normal. No respiratory distress. She has no wheezes. She has no rales. She exhibits no tenderness.  Abdominal: Soft. Bowel sounds are normal.  Neurological: She is alert and oriented to person, place, and time.  Skin: Skin is warm and dry. No rash noted. No erythema. No pallor.  Psychiatric: She has a normal mood and affect. Her behavior is normal. Judgment and thought content normal.    BP 108/68   Pulse 62   Temp 98.7 F (37.1 C) (Oral)   Resp 16   Ht 5\' 2"  (1.575 m)   Wt 168 lb (76.2 kg)   LMP 04/14/2019 (Approximate)   SpO2 97%   BMI 30.73 kg/m  Wt Readings from Last 3 Encounters:  04/27/19 168 lb (76.2 kg)  09/02/18 163 lb (73.9 kg)  06/02/18 157 lb (71.2 kg)     Health Maintenance Due  Topic Date Due  . HIV Screening  07/10/2002    There are no preventive care reminders to display for this patient.  Lab Results  Component Value Date   TSH 3.710 09/02/2018   Lab Results  Component Value Date   WBC 6.5 06/04/2018   HGB 14.3 06/04/2018   HCT 43.1 06/04/2018   MCV 89 06/04/2018   PLT 192 06/04/2018   Lab Results  Component Value Date   NA 143 09/02/2018   K 5.1 09/02/2018   CO2 23 09/02/2018   GLUCOSE 81 09/02/2018   BUN 21 (H) 09/02/2018   CREATININE 0.98 09/02/2018   BILITOT 0.6 06/04/2018   ALKPHOS 86 06/04/2018   AST 15 06/04/2018   ALT 15 06/04/2018   PROT 7.0 06/04/2018   ALBUMIN 4.6 06/04/2018   CALCIUM 10.1 09/02/2018   Lab Results  Component Value Date   CHOL 153 06/04/2018   Lab Results  Component Value Date   HDL 51 06/04/2018   Lab Results  Component Value Date   LDLCALC 93 06/04/2018   Lab Results  Component Value Date   TRIG 46 06/04/2018    Lab Results  Component Value Date   CHOLHDL 3.0 06/04/2018   No results found for: HGBA1C    Assessment & Plan:   Problem List Items Addressed This Visit      Endocrine   Hypothyroidism   Relevant Orders   TSH    Other Visit Diagnoses    Encounter to establish care    -  Primary   Encounter for contraceptive management, unspecified  type       Relevant Medications   norgestimate-ethinyl estradiol (ORTHO-CYCLEN) 0.25-35 MG-MCG tablet   Screening for endocrine, metabolic and immunity disorder       Relevant Orders   Comprehensive metabolic panel   CBC   Screening for viral disease       Relevant Orders   HIV antibody (with reflex)      Meds ordered this encounter  Medications  . norgestimate-ethinyl estradiol (ORTHO-CYCLEN) 0.25-35 MG-MCG tablet    Sig: Take 1 tablet by mouth daily.    Dispense:  3 Package    Refill:  4    Order Specific Question:   Supervising Provider    Answer:   Forrest Moron [id number]    Follow-up: No follow-ups on file.   PLAN:  Pt has enough Synthroid to maintain current dose while awaiting lab results. Will adjust dosage if necessary - otherwise will refill at current dose for 6 mos before follow up.  Pt wishes to continue on Ortho Cyclen - has helped with cramps and heavy menses for "years". No issue with continuing.  Patient encouraged to call clinic with any questions, comments, or concerns.    Maximiano Coss, NP

## 2019-04-27 NOTE — Patient Instructions (Signed)
° ° ° °  If you have lab work done today you will be contacted with your lab results within the next 2 weeks.  If you have not heard from us then please contact us. The fastest way to get your results is to register for My Chart. ° ° °IF you received an x-ray today, you will receive an invoice from [facility]. Please contact [facility] at [phone] with questions or concerns regarding your invoice.  ° °IF you received labwork today, you will receive an invoice from [facility]. Please contact [facility] at [phone] with questions or concerns regarding your invoice.  ° °Our billing staff will not be able to assist you with questions regarding bills from these companies. ° °You will be contacted with the lab results as soon as they are available. The fastest way to get your results is to activate your My Chart account. Instructions are located on the last page of this paperwork. If you have not heard from us regarding the results in 2 weeks, please contact this office. °  ° ° ° °

## 2019-04-28 ENCOUNTER — Other Ambulatory Visit: Payer: Self-pay | Admitting: Registered Nurse

## 2019-04-28 DIAGNOSIS — E039 Hypothyroidism, unspecified: Secondary | ICD-10-CM

## 2019-04-28 LAB — CBC
Hematocrit: 42.4 % (ref 34.0–46.6)
Hemoglobin: 13.8 g/dL (ref 11.1–15.9)
MCH: 29 pg (ref 26.6–33.0)
MCHC: 32.5 g/dL (ref 31.5–35.7)
MCV: 89 fL (ref 79–97)
Platelets: 226 10*3/uL (ref 150–450)
RBC: 4.76 x10E6/uL (ref 3.77–5.28)
RDW: 13.1 % (ref 11.7–15.4)
WBC: 9.5 10*3/uL (ref 3.4–10.8)

## 2019-04-28 LAB — COMPREHENSIVE METABOLIC PANEL
ALT: 17 IU/L (ref 0–32)
AST: 17 IU/L (ref 0–40)
Albumin/Globulin Ratio: 1.6 (ref 1.2–2.2)
Albumin: 4.5 g/dL (ref 3.8–4.8)
Alkaline Phosphatase: 73 IU/L (ref 39–117)
BUN/Creatinine Ratio: 18 (ref 9–23)
BUN: 16 mg/dL (ref 6–20)
Bilirubin Total: 0.6 mg/dL (ref 0.0–1.2)
CO2: 22 mmol/L (ref 20–29)
Calcium: 9.6 mg/dL (ref 8.7–10.2)
Chloride: 104 mmol/L (ref 96–106)
Creatinine, Ser: 0.9 mg/dL (ref 0.57–1.00)
GFR calc Af Amer: 99 mL/min/{1.73_m2} (ref >59)
GFR calc non Af Amer: 85 mL/min/{1.73_m2} (ref >59)
Globulin, Total: 2.8 g/dL (ref 1.5–4.5)
Glucose: 92 mg/dL (ref 65–99)
Potassium: 5.2 mmol/L (ref 3.5–5.2)
Sodium: 137 mmol/L (ref 134–144)
Total Protein: 7.3 g/dL (ref 6.0–8.5)

## 2019-04-28 LAB — HIV ANTIBODY (ROUTINE TESTING W REFLEX): HIV Screen 4th Generation wRfx: NONREACTIVE

## 2019-04-28 LAB — TSH: TSH: 1.36 u[IU]/mL (ref 0.450–4.500)

## 2019-04-28 MED ORDER — LEVOTHYROXINE SODIUM 112 MCG PO TABS: 112.0000 ug | ORAL_TABLET | Freq: Every day | ORAL | 1 refills | Status: DC

## 2019-04-28 NOTE — Progress Notes (Signed)
TSH Results in - wnl. Refill levothyroxine on current dose: 128mcg PO qd.  Pt will follow up in 6 mos - orders entered for TSH prior to that visit. Patient encouraged to call clinic with any questions, comments, or concerns.   Kathrin Ruddy, NP

## 2019-05-12 ENCOUNTER — Other Ambulatory Visit: Payer: Self-pay | Admitting: Pediatric Intensive Care

## 2019-05-12 ENCOUNTER — Telehealth: Payer: Self-pay | Admitting: Registered Nurse

## 2019-05-12 DIAGNOSIS — Z20822 Contact with and (suspected) exposure to covid-19: Secondary | ICD-10-CM

## 2019-05-12 NOTE — Telephone Encounter (Signed)
Spoke with pt to inform her appt will be at 8:50. Confirmed

## 2019-05-17 LAB — NOVEL CORONAVIRUS, NAA: SARS-CoV-2, NAA: NOT DETECTED

## 2019-10-22 ENCOUNTER — Other Ambulatory Visit: Payer: Self-pay

## 2019-10-22 ENCOUNTER — Ambulatory Visit (INDEPENDENT_AMBULATORY_CARE_PROVIDER_SITE_OTHER): Payer: BC Managed Care – PPO | Admitting: Registered Nurse

## 2019-10-22 DIAGNOSIS — E039 Hypothyroidism, unspecified: Secondary | ICD-10-CM

## 2019-10-23 ENCOUNTER — Encounter: Payer: Self-pay | Admitting: Registered Nurse

## 2019-10-23 LAB — TSH: TSH: 1.76 u[IU]/mL (ref 0.450–4.500)

## 2019-10-23 NOTE — Progress Notes (Signed)
Normal TSH

## 2019-10-25 ENCOUNTER — Ambulatory Visit: Payer: BC Managed Care – PPO | Admitting: Registered Nurse

## 2019-11-26 ENCOUNTER — Other Ambulatory Visit: Payer: Self-pay | Admitting: Registered Nurse

## 2019-11-26 DIAGNOSIS — E039 Hypothyroidism, unspecified: Secondary | ICD-10-CM

## 2020-02-17 ENCOUNTER — Ambulatory Visit: Payer: 59 | Attending: Internal Medicine

## 2020-02-17 DIAGNOSIS — Z23 Encounter for immunization: Secondary | ICD-10-CM

## 2020-02-17 NOTE — Progress Notes (Signed)
   Covid-19 Vaccination Clinic  Name:  April Butler    MRN: [id number] DOB: Apr 29, 1986  02/17/2020  [name] was observed post Covid-19 immunization for 15 minutes without incident. She was provided with Vaccine Information Sheet and instruction to access the V-Safe system.   [name] was instructed to call 911 with any severe reactions post vaccine: Marland Kitchen Difficulty breathing  . Swelling of face and throat  . A fast heartbeat  . A bad rash all over body  . Dizziness and weakness   Immunizations Administered    Name Date Dose VIS Date Route   Pfizer COVID-19 Vaccine 02/17/2020 11:34 AM 0.3 mL 10/29/2019 Intramuscular   Manufacturer: [employer], [employer]   Lot: [id number]   NDC: [address]-3

## 2020-03-13 ENCOUNTER — Ambulatory Visit: Payer: Self-pay | Attending: Internal Medicine

## 2020-03-13 DIAGNOSIS — Z23 Encounter for immunization: Secondary | ICD-10-CM

## 2020-03-13 NOTE — Progress Notes (Signed)
   Covid-19 Vaccination Clinic  Name:  April Butler    MRN: [id number] DOB: 1986-01-21  03/13/2020  [name] was observed post Covid-19 immunization for 15 minutes without incident. She was provided with Vaccine Information Sheet and instruction to access the V-Safe system.   [name] was instructed to call 911 with any severe reactions post vaccine: Marland Kitchen Difficulty breathing  . Swelling of face and throat  . A fast heartbeat  . A bad rash all over body  . Dizziness and weakness   Immunizations Administered    Name Date Dose VIS Date Route   Pfizer COVID-19 Vaccine 03/13/2020 11:13 AM 0.3 mL 01/12/2019 Intramuscular   Manufacturer: [employer], [employer]   Lot: [id number]   NDC: [address]-6

## 2020-05-05 ENCOUNTER — Other Ambulatory Visit: Payer: Self-pay | Admitting: Registered Nurse

## 2020-05-05 DIAGNOSIS — Z309 Encounter for contraceptive management, unspecified: Secondary | ICD-10-CM

## 2020-05-13 ENCOUNTER — Other Ambulatory Visit: Payer: Self-pay | Admitting: Registered Nurse

## 2020-05-13 DIAGNOSIS — E039 Hypothyroidism, unspecified: Secondary | ICD-10-CM

## 2020-05-15 ENCOUNTER — Telehealth: Payer: Self-pay | Admitting: Registered Nurse

## 2020-05-15 NOTE — Telephone Encounter (Signed)
Pt is traveling abroad and will return to the [place] in July. LVMTCB

## 2020-05-15 NOTE — Telephone Encounter (Signed)
Please schedule a f/u appt for any further refills. 30 Day supply has been sent into the lab.

## 2020-05-15 NOTE — Telephone Encounter (Signed)
No further refills without office visit 

## 2020-06-13 ENCOUNTER — Other Ambulatory Visit: Payer: Self-pay | Admitting: Registered Nurse

## 2020-06-13 DIAGNOSIS — E039 Hypothyroidism, unspecified: Secondary | ICD-10-CM

## 2020-07-14 ENCOUNTER — Other Ambulatory Visit: Payer: Self-pay | Admitting: Registered Nurse

## 2020-07-14 DIAGNOSIS — E039 Hypothyroidism, unspecified: Secondary | ICD-10-CM

## 2020-07-14 NOTE — Telephone Encounter (Signed)
It seems that pt has found a new primary office.   Routing to their team for review of med and refill.

## 2020-08-06 ENCOUNTER — Other Ambulatory Visit: Payer: Self-pay | Admitting: Registered Nurse

## 2020-08-06 DIAGNOSIS — E039 Hypothyroidism, unspecified: Secondary | ICD-10-CM

## 2020-08-06 NOTE — Telephone Encounter (Signed)
° °  Notes to clinic:  review for refill Looks like patient is seeing [facility]    Requested Prescriptions  Pending Prescriptions Disp Refills   levothyroxine (SYNTHROID) 112 MCG tablet [Pharmacy Med Name: LEVOTHYROXINE 112 MCG TABLET] 90 tablet 1    Sig: TAKE 1 TABLET BY MOUTH EVERY DAY      Endocrinology:  Hypothyroid Agents Failed - 08/06/2020  1:33 PM      Failed - TSH needs to be rechecked within 3 months after an abnormal result. Refill until TSH is due.      Failed - Valid encounter within last 12 months    Recent Outpatient Visits           1 year ago Encounter to establish care   [facility], Gerlene Burdock, NP   1 year ago Strain of neck muscle, initial encounter   [facility], [place] D, PA-C   2 years ago Annual physical exam   [facility] [place], [place] D, PA-C              Passed - TSH in normal range and within 360 days    TSH  Date Value Ref Range Status  10/22/2019 1.760 0.450 - 4.500 uIU/mL Final

## 2020-08-07 NOTE — Telephone Encounter (Signed)
Received prescription request , patient last seen on 04/2019. Please schedule an appointment.

## 2020-08-08 NOTE — Telephone Encounter (Signed)
08/08/2020 - PATIENT IS REQUESTING A REFILL ON HER LEVOTHYROXINE 112 MCG. I TRIED TO CALL AND SCHEDULE AN OFFICE VISIT WITH RICH MORROW BUT I HAD TO LEAVE A VOICE MAIL FOR HER TO RETURN MY CALL. I WILL ROUTE THIS MESSAGE BACK TO [facility] FOR REVIEW. MBC

## 2020-08-10 NOTE — Telephone Encounter (Signed)
Pt needs OV before further fills.

## 2022-03-25 NOTE — Telephone Encounter (Signed)
NA
